# Patient Record
Sex: Female | Born: 1955 | Race: White | Hispanic: No | Smoking: Never smoker
Health system: Southern US, Community
[De-identification: ages and names within clinical notes are randomized; demographics above are authoritative.]

## PROBLEM LIST (undated history)

## (undated) DIAGNOSIS — G5691 Unspecified mononeuropathy of right upper limb: Secondary | ICD-10-CM

## (undated) DIAGNOSIS — S62609A Fracture of unspecified phalanx of unspecified finger, initial encounter for closed fracture: Secondary | ICD-10-CM

## (undated) HISTORY — PX: SPINE SURGERY: SHX786

## (undated) HISTORY — PX: OVARIAN CYST SURGERY: SHX726

## (undated) HISTORY — PX: BACK SURGERY: SHX140

---

## 2015-11-20 ENCOUNTER — Emergency Department (INDEPENDENT_AMBULATORY_CARE_PROVIDER_SITE_OTHER)
Admission: EM | Admit: 2015-11-20 | Discharge: 2015-11-20 | Disposition: A | Discharge status: Home / Self Care | Payer: BLUE CROSS/BLUE SHIELD | Source: Home / Self Care | Attending: Emergency Medicine | Admitting: Emergency Medicine

## 2015-11-20 ENCOUNTER — Emergency Department (INDEPENDENT_AMBULATORY_CARE_PROVIDER_SITE_OTHER): Payer: BLUE CROSS/BLUE SHIELD

## 2015-11-20 ENCOUNTER — Encounter (HOSPITAL_COMMUNITY): Payer: Self-pay | Admitting: Emergency Medicine

## 2015-11-20 DIAGNOSIS — S62609D Fracture of unspecified phalanx of unspecified finger, subsequent encounter for fracture with routine healing: Secondary | ICD-10-CM

## 2015-11-20 HISTORY — DX: Unspecified mononeuropathy of right upper limb: G56.91

## 2015-11-20 NOTE — Discharge Instructions (Signed)
Finger Fracture °Finger fractures are breaks in the bones of the fingers. There are many types of fractures. There are also different ways of treating these fractures. Your doctor will talk with you about the best way to treat your fracture. °Injury is the main cause of broken fingers. This includes: °· Injuries while playing sports. °· Workplace injuries. °· Falls. °HOME CARE °· Follow your doctor's instructions for: °¨ Activities. °¨ Exercises. °¨ Physical therapy. °· Take medicines only as told by your doctor for pain, discomfort, or fever. °GET HELP IF: °You have pain or swelling that limits: °· The motion of your fingers. °· The use of your fingers. °GET HELP RIGHT AWAY IF: °· You cannot feel your fingers, or your fingers become numb. °  °This information is not intended to replace advice given to you by your health care provider. Make sure you discuss any questions you have with your health care provider. °  °Document Released: 02/21/2008 Document Revised: 09/25/2014 Document Reviewed: 04/16/2013 °Elsevier Interactive Patient Education ©2016 [employer] ° °

## 2015-11-20 NOTE — ED Provider Notes (Signed)
CSN: [id number]     Arrival date & time 11/20/15  1433 History   First MD Initiated Contact with Patient 11/20/15 [phone]     Chief Complaint  Patient presents with  . Finger Injury   (Consider location/radiation/quality/duration/timing/severity/associated sxs/prior Treatment) HPI History obtained from patient:   LOCATION:left ring finger SEVERITY:1-2 DURATION:1100 CONTEXT:running with dogs, big dog became excited and pulled hand, now with finger pain QUALITY: MODIFYING FACTORS:straightened position of finger prior to arrival. No meds.  ASSOCIATED SYMPTOMS: none TIMING:constant OCCUPATION:  Past Medical History  Diagnosis Date  . Neuropathy of right forearm    Past Surgical History  Procedure Laterality Date  . Ovarian cyst surgery    . Spine surgery     History reviewed. No pertinent family history. Social History  Substance Use Topics  . Smoking status: Never Smoker   . Smokeless tobacco: None  . Alcohol Use: No   OB History    No data available     Review of Systems Left ring finger injury Allergies  Review of patient's allergies indicates no known allergies.  Home Medications   Prior to Admission medications   Medication Sig Start Date End Date Taking? Authorizing Provider  gabapentin (NEURONTIN) 300 MG capsule Take 300 mg by mouth 3 (three) times daily.   Yes Historical Provider, MD   Meds Ordered and Administered this Visit  Medications - No data to display  BP 145/93 mmHg  Pulse 60  Temp(Src) 97.6 F (36.4 C) (Oral)  SpO2 100% No data found.   Physical Exam  Constitutional: She is oriented to person, place, and time. She appears well-developed and well-nourished.  HENT:  Head: Normocephalic and atraumatic.  Pulmonary/Chest: Effort normal.  Musculoskeletal: She exhibits tenderness.       Left hand: She exhibits decreased range of motion, tenderness, bony tenderness and deformity.       Hands: Neurological: She is alert and oriented to person,  place, and time.  Skin: Skin is warm and dry.  Psychiatric: She has a normal mood and affect. Her behavior is normal.  Nursing note and vitals reviewed.   ED Course  Procedures (including critical care time)  Labs Review Labs Reviewed - No data to display  Imaging Review No results found.   Visual Acuity Review  Right Eye Distance:   Left Eye Distance:   Bilateral Distance:    Right Eye Near:   Left Eye Near:    Bilateral Near:        Review with patient XR results. Discussed with Dr. Merlyn LotKuzma splint and follow up in his office MDM   1. Finger fracture, left, with routine healing, subsequent encounter     Patient is reassured that there are no issues that require transfer to higher level of care at this time.  Patient is advised to continue home symptomatic treatment. Prescription is sent to  pharmacy patient has indicated.  Patient is advised that if there are new or worsening symptoms or attend the emergency department, or contact primary care provider. Instructions of care provided discharged home in stable condition. Return to work/school note provided.  THIS NOTE WAS GENERATED USING A VOICE RECOGNITION SOFTWARE PROGRAM. ALL REASONABLE EFFORTS  WERE MADE TO PROOFREAD THIS DOCUMENT FOR ACCURACY.     Tharon AquasFrank C Deante Blough, PA 11/20/15 [phone]

## 2015-11-20 NOTE — ED Notes (Signed)
[facility] a [facility] of an injury to her ring finger on her left hand that occurred today. The patient stated that she was walking a dog and her finger got caught in the leash. The patient's finger had obvious deformity but good PMS.

## 2015-11-24 ENCOUNTER — Encounter (HOSPITAL_BASED_OUTPATIENT_CLINIC_OR_DEPARTMENT_OTHER): Payer: Self-pay | Admitting: *Deleted

## 2015-11-24 ENCOUNTER — Other Ambulatory Visit: Payer: Self-pay | Admitting: Orthopedic Surgery

## 2015-11-25 ENCOUNTER — Ambulatory Visit (HOSPITAL_BASED_OUTPATIENT_CLINIC_OR_DEPARTMENT_OTHER)
Admission: RE | Admit: 2015-11-25 | Discharge: 2015-11-25 | Disposition: A | Discharge status: Home / Self Care | Payer: BLUE CROSS/BLUE SHIELD | Source: Ambulatory Visit | Attending: Orthopedic Surgery | Admitting: Orthopedic Surgery

## 2015-11-25 ENCOUNTER — Ambulatory Visit (HOSPITAL_BASED_OUTPATIENT_CLINIC_OR_DEPARTMENT_OTHER): Payer: BLUE CROSS/BLUE SHIELD | Admitting: Anesthesiology

## 2015-11-25 ENCOUNTER — Encounter (HOSPITAL_BASED_OUTPATIENT_CLINIC_OR_DEPARTMENT_OTHER): Payer: Self-pay

## 2015-11-25 ENCOUNTER — Encounter (HOSPITAL_BASED_OUTPATIENT_CLINIC_OR_DEPARTMENT_OTHER)
Admission: RE | Disposition: A | Discharge status: Home / Self Care | Payer: Self-pay | Source: Ambulatory Visit | Attending: Orthopedic Surgery

## 2015-11-25 DIAGNOSIS — S62625A Displaced fracture of medial phalanx of left ring finger, initial encounter for closed fracture: Secondary | ICD-10-CM | POA: Insufficient documentation

## 2015-11-25 DIAGNOSIS — X501XXA Overexertion from prolonged static or awkward postures, initial encounter: Secondary | ICD-10-CM | POA: Insufficient documentation

## 2015-11-25 DIAGNOSIS — Z79899 Other long term (current) drug therapy: Secondary | ICD-10-CM | POA: Diagnosis not present

## 2015-11-25 HISTORY — DX: Fracture of unspecified phalanx of unspecified finger, initial encounter for closed fracture: S62.609A

## 2015-11-25 HISTORY — PX: OPEN REDUCTION INTERNAL FIXATION (ORIF) PROXIMAL PHALANX: SHX6235

## 2015-11-25 SURGERY — OPEN REDUCTION INTERNAL FIXATION (ORIF) PROXIMAL PHALANX
Anesthesia: General | Site: Finger | Laterality: Left

## 2015-11-25 MED ORDER — LIDOCAINE HCL (CARDIAC) 20 MG/ML IV SOLN
INTRAVENOUS | Status: AC
  Filled 2015-11-25: qty 5

## 2015-11-25 MED ORDER — DIPHENHYDRAMINE HCL 50 MG/ML IJ SOLN
INTRAMUSCULAR | Status: DC | PRN
  Administered 2015-11-25: 12.5 mg via INTRAVENOUS

## 2015-11-25 MED ORDER — CHLORHEXIDINE GLUCONATE 4 % EX LIQD: 60.0000 mL | Freq: Once | CUTANEOUS | Status: DC

## 2015-11-25 MED ORDER — BUPIVACAINE-EPINEPHRINE (PF) 0.5% -1:200000 IJ SOLN
INTRAMUSCULAR | Status: DC | PRN
  Administered 2015-11-25: 25 mL via PERINEURAL

## 2015-11-25 MED ORDER — MEPERIDINE HCL 25 MG/ML IJ SOLN: 6.2500 mg | INTRAMUSCULAR | Status: DC | PRN

## 2015-11-25 MED ORDER — OXYCODONE HCL 5 MG PO TABS: 5.0000 mg | ORAL_TABLET | Freq: Once | ORAL | Status: DC | PRN

## 2015-11-25 MED ORDER — LACTATED RINGERS IV SOLN
INTRAVENOUS | Status: DC
  Administered 2015-11-25 (×2): via INTRAVENOUS

## 2015-11-25 MED ORDER — ONDANSETRON HCL 4 MG/2ML IJ SOLN
INTRAMUSCULAR | Status: AC
  Filled 2015-11-25: qty 2

## 2015-11-25 MED ORDER — OXYCODONE HCL 5 MG/5ML PO SOLN: 5.0000 mg | Freq: Once | ORAL | Status: DC | PRN

## 2015-11-25 MED ORDER — FENTANYL CITRATE (PF) 100 MCG/2ML IJ SOLN
INTRAMUSCULAR | Status: AC
  Filled 2015-11-25: qty 2

## 2015-11-25 MED ORDER — MIDAZOLAM HCL 2 MG/2ML IJ SOLN
1.0000 mg | INTRAMUSCULAR | Status: DC | PRN
  Administered 2015-11-25: 2 mg via INTRAVENOUS

## 2015-11-25 MED ORDER — SCOPOLAMINE 1 MG/3DAYS TD PT72: 1.0000 | MEDICATED_PATCH | Freq: Once | TRANSDERMAL | Status: DC | PRN

## 2015-11-25 MED ORDER — CEFAZOLIN SODIUM-DEXTROSE 2-3 GM-% IV SOLR
2.0000 g | INTRAVENOUS | Status: AC
  Administered 2015-11-25: 2 g via INTRAVENOUS

## 2015-11-25 MED ORDER — HYDROCODONE-ACETAMINOPHEN 5-325 MG PO TABS: ORAL_TABLET | ORAL | Status: AC

## 2015-11-25 MED ORDER — DEXAMETHASONE SODIUM PHOSPHATE 10 MG/ML IJ SOLN
INTRAMUSCULAR | Status: AC
  Filled 2015-11-25: qty 1

## 2015-11-25 MED ORDER — LIDOCAINE HCL (CARDIAC) 20 MG/ML IV SOLN
INTRAVENOUS | Status: DC | PRN
  Administered 2015-11-25: 50 mg via INTRAVENOUS

## 2015-11-25 MED ORDER — FENTANYL CITRATE (PF) 100 MCG/2ML IJ SOLN
50.0000 ug | INTRAMUSCULAR | Status: DC | PRN
  Administered 2015-11-25: 100 ug via INTRAVENOUS

## 2015-11-25 MED ORDER — MIDAZOLAM HCL 2 MG/2ML IJ SOLN
INTRAMUSCULAR | Status: AC
Start: 2015-11-25 — End: 2015-11-25
  Filled 2015-11-25: qty 2

## 2015-11-25 MED ORDER — PROPOFOL 500 MG/50ML IV EMUL
INTRAVENOUS | Status: DC | PRN
  Administered 2015-11-25: 75 ug/kg/min via INTRAVENOUS

## 2015-11-25 MED ORDER — HYDROMORPHONE HCL 1 MG/ML IJ SOLN: 0.2500 mg | INTRAMUSCULAR | Status: DC | PRN

## 2015-11-25 MED ORDER — PROPOFOL 10 MG/ML IV BOLUS
INTRAVENOUS | Status: AC
  Filled 2015-11-25: qty 20

## 2015-11-25 MED ORDER — DEXAMETHASONE SODIUM PHOSPHATE 4 MG/ML IJ SOLN
INTRAMUSCULAR | Status: DC | PRN
  Administered 2015-11-25: 10 mg via INTRAVENOUS

## 2015-11-25 MED ORDER — GLYCOPYRROLATE 0.2 MG/ML IJ SOLN: 0.2000 mg | Freq: Once | INTRAMUSCULAR | Status: DC | PRN

## 2015-11-25 SURGICAL SUPPLY — 58 items
BANDAGE ACE 3X5.8 VEL STRL LF (GAUZE/BANDAGES/DRESSINGS) ×3 IMPLANT
BIT DRILL 6MM (BIT) ×1 IMPLANT
BIT DRILL 8MM (BIT) ×1 IMPLANT
BLADE MINI RND TIP GREEN BEAV (BLADE) IMPLANT
BLADE SURG 15 STRL LF DISP TIS (BLADE) ×2 IMPLANT
BLADE SURG 15 STRL SS (BLADE) ×4
BNDG ESMARK 4X9 LF (GAUZE/BANDAGES/DRESSINGS) ×3 IMPLANT
BNDG GAUZE ELAST 4 BULKY (GAUZE/BANDAGES/DRESSINGS) ×3 IMPLANT
CHLORAPREP W/TINT 26ML (MISCELLANEOUS) ×3 IMPLANT
CORDS BIPOLAR (ELECTRODE) ×3 IMPLANT
COVER BACK TABLE 60X90IN (DRAPES) ×3 IMPLANT
COVER MAYO STAND STRL (DRAPES) ×3 IMPLANT
CUFF TOURNIQUET SINGLE 18IN (TOURNIQUET CUFF) ×3 IMPLANT
DRAPE EXTREMITY T 121X128X90 (DRAPE) ×3 IMPLANT
DRAPE OEC MINIVIEW 54X84 (DRAPES) ×3 IMPLANT
DRAPE SURG 17X23 STRL (DRAPES) ×3 IMPLANT
DRILL BIT 6MM (BIT) ×3
DRILL BIT 8MM (BIT) ×3
GAUZE SPONGE 4X4 12PLY STRL (GAUZE/BANDAGES/DRESSINGS) ×3 IMPLANT
GAUZE XEROFORM 1X8 LF (GAUZE/BANDAGES/DRESSINGS) ×3 IMPLANT
GLOVE BIO SURGEON STRL SZ7.5 (GLOVE) ×3 IMPLANT
GLOVE BIOGEL PI IND STRL 7.0 (GLOVE) ×1 IMPLANT
GLOVE BIOGEL PI IND STRL 8 (GLOVE) ×1 IMPLANT
GLOVE BIOGEL PI IND STRL 8.5 (GLOVE) ×1 IMPLANT
GLOVE BIOGEL PI INDICATOR 7.0 (GLOVE) ×2
GLOVE BIOGEL PI INDICATOR 8 (GLOVE) ×2
GLOVE BIOGEL PI INDICATOR 8.5 (GLOVE) ×2
GLOVE ECLIPSE 6.5 STRL STRAW (GLOVE) ×6 IMPLANT
GLOVE SURG ORTHO 8.0 STRL STRW (GLOVE) ×3 IMPLANT
GOWN STRL REUS W/ TWL LRG LVL3 (GOWN DISPOSABLE) ×1 IMPLANT
GOWN STRL REUS W/TWL LRG LVL3 (GOWN DISPOSABLE) ×2
GOWN STRL REUS W/TWL XL LVL3 (GOWN DISPOSABLE) ×6 IMPLANT
K-WIRE PROS .028 4 (WIRE) ×3 IMPLANT
NEEDLE HYPO 25X1 1.5 SAFETY (NEEDLE) IMPLANT
NS IRRIG 1000ML POUR BTL (IV SOLUTION) ×3 IMPLANT
PACK BASIN DAY SURGERY FS (CUSTOM PROCEDURE TRAY) ×3 IMPLANT
PAD CAST 3X4 CTTN HI CHSV (CAST SUPPLIES) IMPLANT
PAD CAST 4YDX4 CTTN HI CHSV (CAST SUPPLIES) ×1 IMPLANT
PADDING CAST ABS 4INX4YD NS (CAST SUPPLIES) ×2
PADDING CAST ABS COTTON 4X4 ST (CAST SUPPLIES) ×1 IMPLANT
PADDING CAST COTTON 3X4 STRL (CAST SUPPLIES)
PADDING CAST COTTON 4X4 STRL (CAST SUPPLIES) ×2
SCREW SELF TAP CORTEX 1.0 6MM (Screw) ×9 IMPLANT
SCREW SELF TAP CORTEX 1.0 7MM (Screw) ×3 IMPLANT
SLEEVE SCD COMPRESS KNEE MED (MISCELLANEOUS) ×3 IMPLANT
SPLINT PLASTER CAST XFAST 3X15 (CAST SUPPLIES) IMPLANT
SPLINT PLASTER XTRA FASTSET 3X (CAST SUPPLIES)
STOCKINETTE 4X48 STRL (DRAPES) ×3 IMPLANT
SUT ETHILON 3 0 PS 1 (SUTURE) IMPLANT
SUT ETHILON 4 0 PS 2 18 (SUTURE) ×3 IMPLANT
SUT MERSILENE 4 0 P 3 (SUTURE) IMPLANT
SUT VIC AB 3-0 PS1 18 (SUTURE)
SUT VIC AB 3-0 PS1 18XBRD (SUTURE) IMPLANT
SUT VICRYL 4-0 PS2 18IN ABS (SUTURE) IMPLANT
SYR BULB 3OZ (MISCELLANEOUS) ×3 IMPLANT
SYR CONTROL 10ML LL (SYRINGE) IMPLANT
TOWEL OR 17X24 6PK STRL BLUE (TOWEL DISPOSABLE) ×6 IMPLANT
UNDERPAD 30X30 (UNDERPADS AND DIAPERS) IMPLANT

## 2015-11-25 NOTE — Op Note (Signed)
[id number] 

## 2015-11-25 NOTE — Op Note (Signed)
Intra-operative fluoroscopic images in the AP, lateral, and oblique views were taken and evaluated by myself.  Reduction and hardware placement were confirmed.  There was no intraarticular penetration of permanent hardware.  

## 2015-11-25 NOTE — Anesthesia Procedure Notes (Addendum)
Anesthesia Regional Block:  Infraclavicular brachial plexus block  Pre-Anesthetic Checklist: ,, timeout performed, Correct Patient, Correct Site, Correct Laterality, Correct Procedure, Correct Position, site marked, Risks and benefits discussed,  Surgical consent,  Pre-op evaluation,  At surgeon's request and post-op pain management  Laterality: Left and Upper  Prep: chloraprep       Needles:  Injection technique: Single-shot  Needle Type: Echogenic Stimulator Needle     Needle Length: 5cm 5 cm Needle Gauge: 21 and 21 G    Additional Needles:  Procedures: ultrasound guided (picture in chart) Infraclavicular brachial plexus block Narrative:  Start time: 11/25/2015 9:30 AM End time: 11/25/2015 9:35 AM Injection made incrementally with aspirations every 5 mL.  Performed by: Personally  Anesthesiologist: CREWS, DAVID   Procedure Name: MAC Date/Time: 11/25/2015 10:11 AM Performed by: Caren MacadamARTER, Braxtin Bamba W Pre-anesthesia Checklist: Patient identified, Timeout performed, Emergency Drugs available, Suction available and Patient being monitored Patient Re-evaluated:Patient Re-evaluated prior to inductionOxygen Delivery Method: Simple face mask Preoxygenation: Pre-oxygenation with 100% oxygen Intubation Type: IV induction      Left IFC block image

## 2015-11-25 NOTE — Anesthesia Postprocedure Evaluation (Signed)
Anesthesia Post Note  Patient: Kelsey PalauSharon L Nottingham  Procedure(s) Performed: Procedure(s) (LRB): OPEN REDUCTION INTERNAL FIXATION (ORIF) LEFT RING FINGER MIDDLE PHALANX (Left)  Patient location during evaluation: PACU Anesthesia Type: Regional and MAC Level of consciousness: awake and alert Pain management: pain level controlled Vital Signs Assessment: post-procedure vital signs reviewed and stable Respiratory status: spontaneous breathing, nonlabored ventilation and respiratory function stable Cardiovascular status: blood pressure returned to baseline and stable Postop Assessment: no signs of nausea or vomiting Anesthetic complications: no    Last Vitals:  Filed Vitals:   11/25/15 1148 11/25/15 1200  BP:  107/76  Pulse: 53 37  Temp: 36.4 C   Resp: 16 14    Last Pain:  Filed Vitals:   11/25/15 1208  PainSc: 0-No pain                 [name]

## 2015-11-25 NOTE — Progress Notes (Signed)
Assisted Dr. Crews with left, ultrasound guided, infraclavicular block. Side rails up, monitors on throughout procedure. See vital signs in flow sheet. Tolerated Procedure well. 

## 2015-11-25 NOTE — Op Note (Signed)
I assisted Surgeon(s) and Role:    * Kevin Hanzel Pizzo, MD - PrimaBetha Loary    * Cindee SaltGary Maanasa Aderhold, MD - Assisting on the Procedure(s): OPEN REDUCTION INTERNAL FIXATION (ORIF) LEFT RING FINGER MIDDLE PHALANX on 11/25/2015.  I provided assistance on this case as follows: approach, retraction, reduction and stabilization of the fracture, internal fixation, closure, and splint application. I was present for the entire case.  Electronically signed by: Nicki ReaperKUZMA,Lanique Gonzalo R, MD Date: 11/25/2015 Time: 3:21 PM

## 2015-11-25 NOTE — Op Note (Signed)
NAMLourdes Sledge:  Wojciak, Bekki               ACCOUNT NO.:  [id number]  MEDICAL RECORD NO.:  [id number]  LOCATION:                                 FACILITY:  PHYSICIAN:  Betha LoaKevin Ebony Yorio, MD        DATE OF BIRTH:  [birth year]  DATE OF PROCEDURE:  11/25/2015 DATE OF DISCHARGE:                              OPERATIVE REPORT   PREOPERATIVE DIAGNOSIS:  Left ring finger middle phalanx fracture.  POSTOPERATIVE DIAGNOSIS:  Left ring finger middle phalanx fracture.  PROCEDURE:  Open reduction and internal fixation of left ring finger middle phalanx fracture.  SURGEON:  Betha LoaKevin Kallyn Demarcus, M.D.  ASSISTANT:  Cindee SaltGary Cantrell Martus, M.D.  ANESTHESIA:  Regional with sedation.  IV FLUIDS:  Per anesthesia flow sheet.  ESTIMATED BLOOD LOSS:  Minimal.  COMPLICATIONS:  None.  SPECIMENS:  None.  TOURNIQUET TIME:  65 minutes.  DISPOSITION:  Stable to PACU.  INDICATIONS:  Ms. Sheliah HatchWarner is a 60 year old female, who was walking the dog when a leash caught on her ring finger, caused a twisting type injury.  She was seen in an Urgent Care, where radiographs were taken revealing a middle phalanx fracture, left ring finger.  She was splinted and follow up in the office.  We discussed nonoperative and operative treatment options.  She wished to proceed with operative fixation. Risks, benefits, and alternatives of surgery were discussed including risk of blood loss; infection; damage to nerves, vessels, tendons, ligaments, bone; failure of surgery; need for additional surgery; complications with wound healing, continued pain, nonunion, malunion, and stiffness.  She voiced understanding of these risks and elected to proceed.  OPERATIVE COURSE:  After being identified preoperatively by myself, the patient and I agreed upon procedure site procedure.  The surgical site was marked.  The risks, benefits, and alternatives of surgery were reviewed and she wished to proceed.  Surgical consent had been signed. She was given IV Ancef as  preoperative antibiotic prophylaxis.  She was transferred to the operating room and placed on the operating table in supine position.  Left upper extremity on arm board.  A regional block had been performed by Anesthesia in preoperative holding.  Sedation was induced in the operating room.  Left upper extremity was prepped and draped in normal sterile orthopedic fashion.  Surgical pause was performed between surgeons, anesthesia, operating staff, and all were in agreement as to the patient, procedure, site procedure.  Tourniquet at the proximal aspect of the extremity was inflated to 250 mmHg after exsanguination of the limb with an Esmarch bandage.  Incision was made in the dorsum of the left ring finger middle phalanx.  Carried into subcutaneous tissues by spreading technique.  Bipolar electrocautery was used to obtain hemostasis.  The tendon was retracted ulnarly.  The periosteum was sharply incised.  The fracture site was identified.  It was reduced under direct visualization, after clearing soft tissue and hematoma.  It was provisionally stabilized with a 0.028 inch K-wire. The modular hand 1.0 mm screw set was used.  Three screws were able to be placed across the fracture site.  This provided acceptable stabilization.  The C-arm was used in AP and lateral projections to ensure appropriate reduction  position of hardware, which was the case. The wound was copiously irrigated with sterile saline.  A small amount of periosteum was were able to be repaired back over top of the screws. The skin was then closed with 4-0 nylon in a horizontal mattress fashion.  The wound was dressed with sterile Xeroform, 4x4s, and wrapped with a Kerlix bandage slightly.  The volar and dorsal slab splint including the long, ring, and small fingers was placed with the MPs flexed and the IPs extended.  This was wrapped with Kerlix and ACE bandage.  The tourniquet was deflated at approximately 65  minutes. Prior to placing the dressing, the wrist was placed for tenodesis and there was no scissoring.  There was good alignment of the ring finger, clinically.  The operative drapes were broken down.  The patient was awoken from sedation safely.  She was transferred back to stretcher and taken to PACU in stable condition.  I will see her back in the office in 1 week for postoperative followup.  I will give her Norco 5/325, 1-2 p.o. q.6 hours p.r.n. pain, dispensed.     Betha Loa, MD     KK/MEDQ  D:  11/25/2015  T:  11/25/2015  Job:  [id number]

## 2015-11-25 NOTE — Discharge Instructions (Addendum)

## 2015-11-25 NOTE — H&P (Signed)
  Kelsey Wilkinson is an 60 y.o. female.   Chief Complaint: left ring finger fracture HPI: 60 yo female states she was walking dog over weekend when the leash twisted her left ring finger.  [facility] ring finger middle phalanx fracture.  Splinted and followed up in office.    Allergies: No Known Allergies  Past Medical History  Diagnosis Date  . Neuropathy of right forearm   . Finger fracture, left     ring    Past Surgical History  Procedure Laterality Date  . Ovarian cyst surgery    . Spine surgery    . Back surgery      CERVICAL FUSION    Family History: History reviewed. No pertinent family history.  Social History:   reports that she has never smoked. She does not have any smokeless tobacco history on file. She reports that she does not drink alcohol or use illicit drugs.  Medications: Medications Prior to Admission  Medication Sig Dispense Refill  . gabapentin (NEURONTIN) 300 MG capsule Take 300 mg by mouth 3 (three) times daily.      No results found for this or any previous visit (from the past 48 hour(s)).  No results found.   A comprehensive review of systems was negative.   Blood pressure 89/62, pulse 46, temperature 97.7 F (36.5 C), temperature source Oral, resp. rate 11, height 5\' 3"  (1.6 m), weight 54.432 kg (120 lb), SpO2 100 %.  General appearance: alert, cooperative and appears stated age Head: Normocephalic, without obvious abnormality, atraumatic Neck: supple, symmetrical, trachea midline Resp: clear to auscultation bilaterally Cardio: regular rate and rhythm GI: non-tender Extremities: Intact sensation and capillary refill all digits.  +epl/fpl/io.  No wounds.  Pulses: 2+ and symmetric Skin: Skin color, texture, turgor normal. No rashes or lesions Neurologic: Grossly normal Incision/Wound: none  Assessment/Plan Left ring finger middle phalanx fracture.  Non operative and operative treatment options were discussed with  the patient and patient wishes to proceed with operative treatment. Risks, benefits, and alternatives of surgery were discussed and the patient agrees with the plan of care.   Kelsey Wilkinson R 11/25/2015, 10:01 AM

## 2015-11-25 NOTE — Transfer of Care (Signed)
Immediate Anesthesia Transfer of Care Note  Patient: Elizabeth PalauSharon L Pizana  Procedure(s) Performed: Procedure(s): OPEN REDUCTION INTERNAL FIXATION (ORIF) LEFT RING FINGER MIDDLE PHALANX (Left)  Patient Location: PACU  Anesthesia Type:MAC and MAC combined with regional for post-op pain  Level of Consciousness: awake and alert   Airway & Oxygen Therapy: Patient Spontanous Breathing and Patient connected to face mask oxygen  Post-op Assessment: Report given to RN and Post -op Vital signs reviewed and stable  Post vital signs: Reviewed and stable  Last Vitals:  Filed Vitals:   11/25/15 0935 11/25/15 0940  BP:    Pulse: 41 46  Temp:    Resp: 13 11    Complications: No apparent anesthesia complications

## 2015-11-25 NOTE — Anesthesia Preprocedure Evaluation (Addendum)
Anesthesia Evaluation  Patient identified by MRN, date of birth, ID band Patient awake    Reviewed: Allergy & Precautions, NPO status , Patient's Chart, lab work & pertinent test results  Airway Mallampati: I  TM Distance: >3 FB Neck ROM: Full    Dental  (+) Teeth Intact, Dental Advisory Given   Pulmonary    breath sounds clear to auscultation       Cardiovascular  Rhythm:Regular Rate:Normal     Neuro/Psych    GI/Hepatic   Endo/Other    Renal/GU      Musculoskeletal   Abdominal   Peds  Hematology   Anesthesia Other Findings   Reproductive/Obstetrics                             Anesthesia Physical Anesthesia Plan  ASA: I  Anesthesia Plan: Regional and MAC   Post-op Pain Management:    Induction: Intravenous  Airway Management Planned: LMA  Additional Equipment:   Intra-op Plan:   Post-operative Plan: Extubation in OR  Informed Consent: I have reviewed the patients History and Physical, chart, labs and discussed the procedure including the risks, benefits and alternatives for the proposed anesthesia with the patient or authorized representative who has indicated his/her understanding and acceptance.   Dental advisory given  Plan Discussed with: CRNA, Anesthesiologist and Surgeon  Anesthesia Plan Comments:        Anesthesia Quick Evaluation

## 2015-11-25 NOTE — Brief Op Note (Signed)
11/25/2015  11:42 AM  PATIENT:  Kelsey Wilkinson  60 y.o. female  PRE-OPERATIVE DIAGNOSIS:  Left ring finger middle phalanx fracture  POST-OPERATIVE DIAGNOSIS:  Spiral/oblique fracture left ring finger middle phalanx   S62.625A  PROCEDURE:  Procedure(s): OPEN REDUCTION INTERNAL FIXATION (ORIF) LEFT RING FINGER MIDDLE PHALANX (Left)  SURGEON:  Surgeon(s) and Role:    * Betha LoaKevin Tori Cupps, MD - Primary    * Cindee SaltGary Kam Rahimi, MD - Assisting  PHYSICIAN ASSISTANT:   ASSISTANTS: Cindee SaltGary Lovis More, MD   ANESTHESIA:   regional and IV sedation  EBL:  Total I/O In: 1500 [I.V.:1500] Out: 25 [Blood:25]  BLOOD ADMINISTERED:none  DRAINS: none   LOCAL MEDICATIONS USED:  NONE  SPECIMEN:  No Specimen  DISPOSITION OF SPECIMEN:  N/A  COUNTS:  YES  TOURNIQUET:   Total Tourniquet Time Documented: Upper Arm (Left) - -[id number] minutes Total: Upper Arm (Left) - -[phone] minutes   DICTATION: .Other Dictation: Dictation Number [id number]  PLAN OF CARE: Discharge to home after PACU  PATIENT DISPOSITION:  PACU - hemodynamically stable.

## 2015-11-26 ENCOUNTER — Encounter (HOSPITAL_BASED_OUTPATIENT_CLINIC_OR_DEPARTMENT_OTHER): Payer: Self-pay | Admitting: Orthopedic Surgery

## 2017-02-12 IMAGING — DX DG HAND COMPLETE 3+V*L*
3 series · 3 of 3 positions shown · non-contrast
Comparison: None.

CLINICAL DATA: Left hand and ring finger injury while walking the
dog

EXAM:
LEFT HAND - COMPLETE 3+ VIEW

[hand pa]
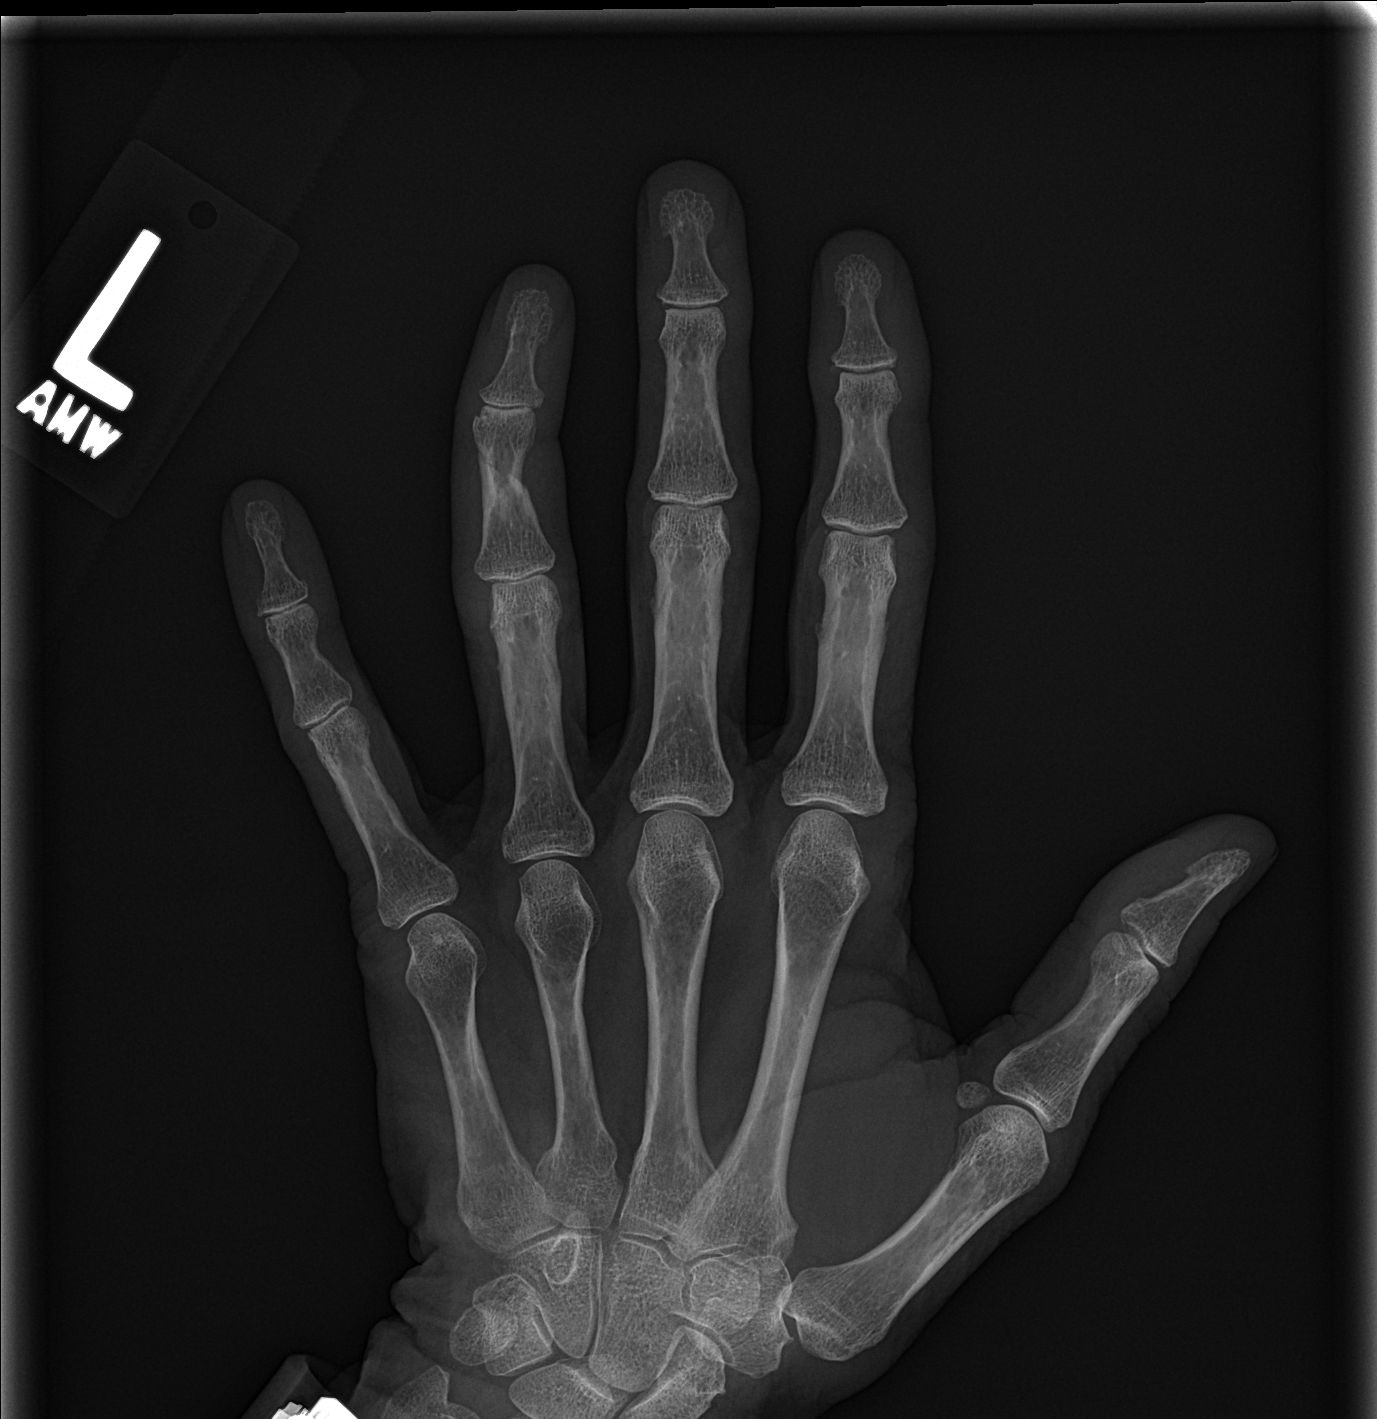

[hand obl]
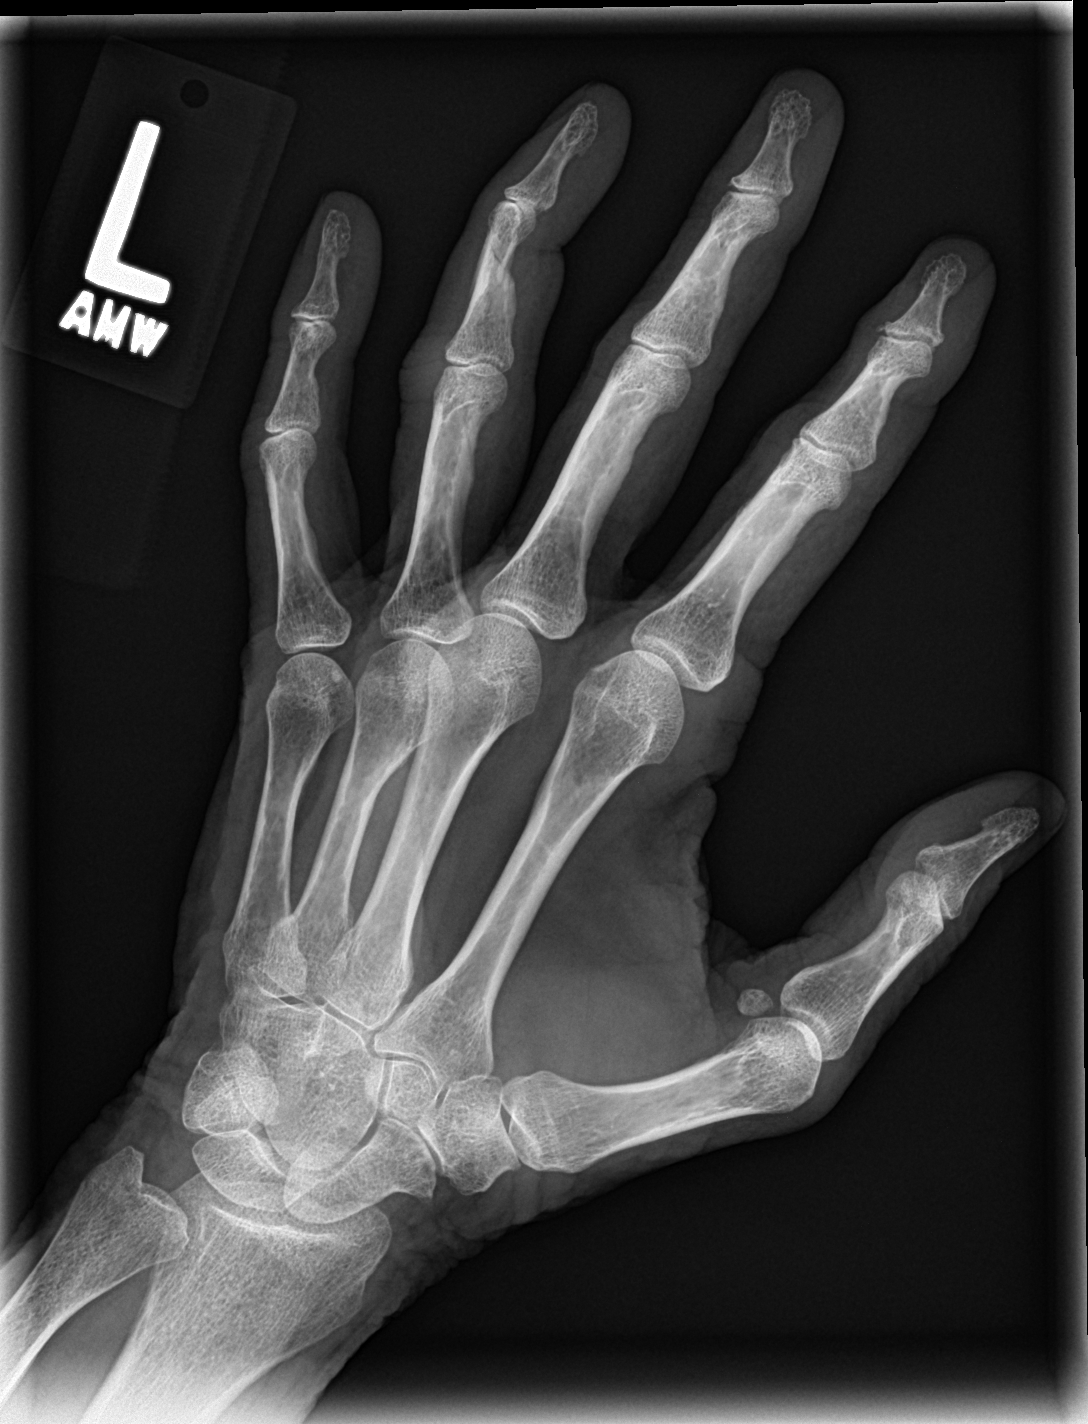

[hand lat]
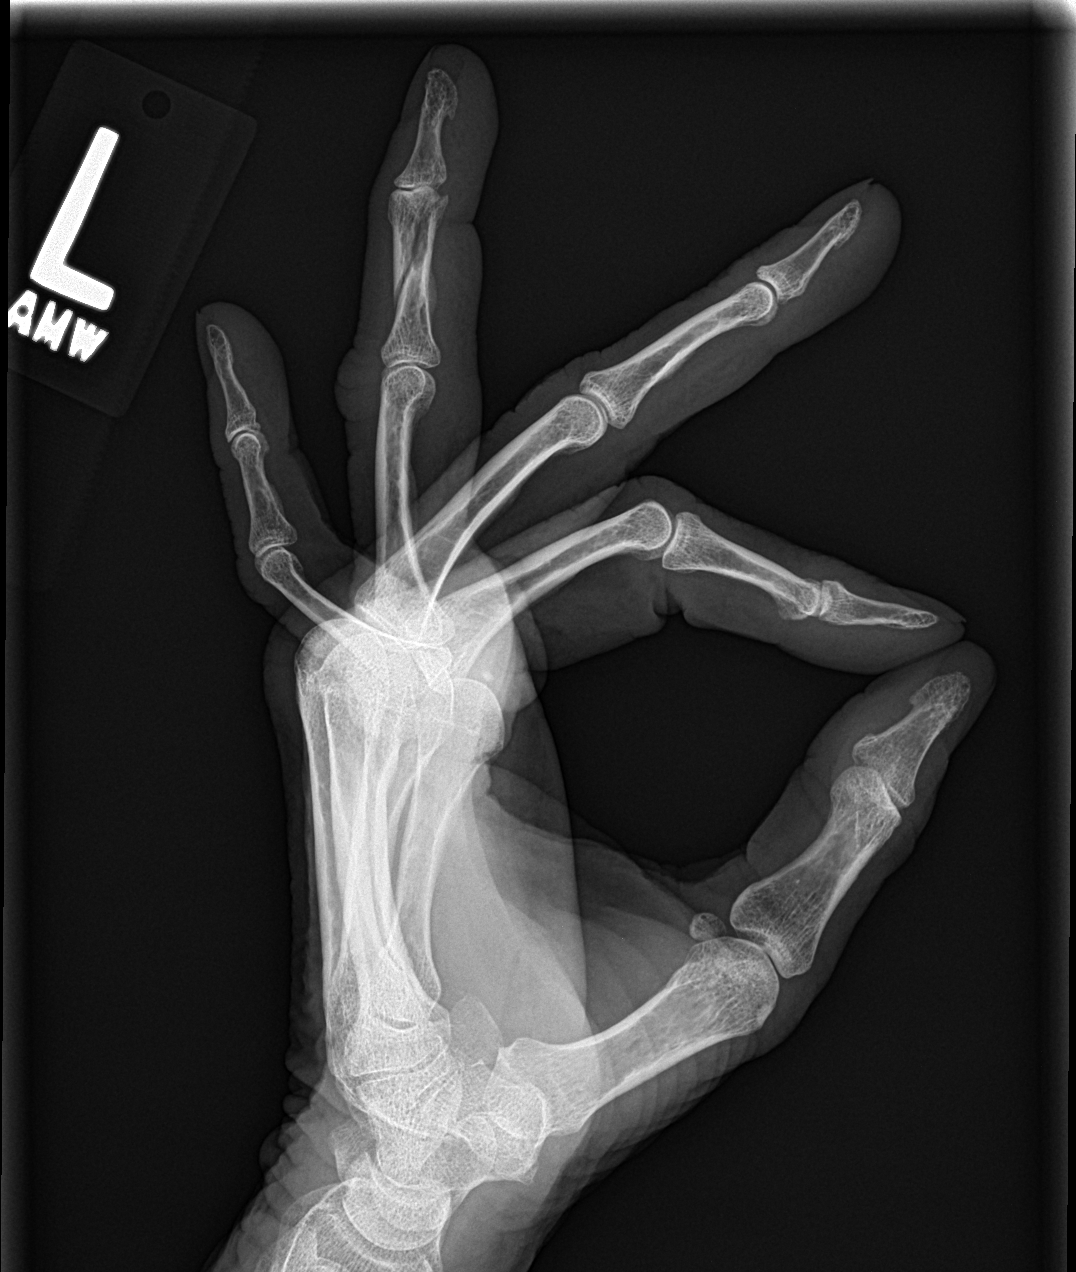

[3 of 3 positions shown; findings below may reference images not displayed]

FINDINGS: There is an acute, oblique fracture involving the mid and distal
shaft of the fourth middle phalanx. The fracture fragments are in
near anatomic alignment. No dislocations.
IMPRESSION: 1. Acute oblique fracture involves the mid and distal shaft of the
fourth the middle phalanx.
